# Patient Record
Sex: Female | Born: 1995 | Race: White | Hispanic: No | Marital: Single | Smoking: Never smoker
Health system: Southern US, Community
[De-identification: ages and names within clinical notes are randomized; demographics above are authoritative.]

## PROBLEM LIST (undated history)

## (undated) HISTORY — PX: NO PAST SURGERIES: SHX2092

---

## 2018-06-30 ENCOUNTER — Ambulatory Visit
Admission: EM | Admit: 2018-06-30 | Discharge: 2018-06-30 | Disposition: A | Discharge status: Home / Self Care | Payer: BC Managed Care – PPO | Attending: Family Medicine | Admitting: Family Medicine

## 2018-06-30 ENCOUNTER — Other Ambulatory Visit: Payer: Self-pay

## 2018-06-30 ENCOUNTER — Ambulatory Visit (INDEPENDENT_AMBULATORY_CARE_PROVIDER_SITE_OTHER)
Admit: 2018-06-30 | Discharge: 2018-06-30 | Disposition: A | Discharge status: Home / Self Care | Payer: BC Managed Care – PPO | Attending: Family Medicine | Admitting: Family Medicine

## 2018-06-30 ENCOUNTER — Encounter: Payer: Self-pay | Admitting: Emergency Medicine

## 2018-06-30 DIAGNOSIS — R82998 Other abnormal findings in urine: Secondary | ICD-10-CM

## 2018-06-30 DIAGNOSIS — Z3202 Encounter for pregnancy test, result negative: Secondary | ICD-10-CM | POA: Diagnosis not present

## 2018-06-30 DIAGNOSIS — R1031 Right lower quadrant pain: Secondary | ICD-10-CM | POA: Diagnosis not present

## 2018-06-30 DIAGNOSIS — R52 Pain, unspecified: Secondary | ICD-10-CM

## 2018-06-30 DIAGNOSIS — N926 Irregular menstruation, unspecified: Secondary | ICD-10-CM

## 2018-06-30 DIAGNOSIS — R102 Pelvic and perineal pain: Secondary | ICD-10-CM

## 2018-06-30 LAB — URINALYSIS, COMPLETE (UACMP) WITH MICROSCOPIC
Bilirubin Urine: NEGATIVE
GLUCOSE, UA: NEGATIVE mg/dL
Ketones, ur: 15 mg/dL — AB
Leukocytes, UA: NEGATIVE
NITRITE: NEGATIVE
SPECIFIC GRAVITY, URINE: 1.02 (ref 1.005–1.030)
pH: 6.5 (ref 5.0–8.0)

## 2018-06-30 LAB — PREGNANCY, URINE: PREG TEST UR: NEGATIVE

## 2018-06-30 MED ORDER — NITROFURANTOIN MONOHYD MACRO 100 MG PO CAPS: 100.0000 mg | ORAL_CAPSULE | Freq: Two times a day (BID) | ORAL | 0 refills | Status: AC

## 2018-06-30 NOTE — ED Provider Notes (Signed)
[facility] ____________________________________________  Time seen: Approximately 4:08 PM  I have reviewed the triage vital signs and the nursing notes.  HISTORY  Chief Complaint Pelvic Pain and Metrorrhagia  HPI Peggy Baker is a 22 y.o. female who presented for evaluation of right pelvic pain with accompanying irregular menstrual bleeding.  Please see previous providers note.  Patient was resumed from Dr. Judd Gaudier pending evaluation of pelvic ultrasound.  In summary, patient presented for evaluation of right pelvic pain present since this past Friday with accompanying irregular vaginal bleeding.  States that she takes oral contraceptives, same medication for the last year, but reports her current started 2 weeks early. Reporting accompanying pelvic pain with this.  States pain is currently mild, up to moderate and intermittently sharp.  Does report that she is noticed urinary frequency accompanying this as well.  No burning with urination but states she is definitely going to the bathroom much more often and with some urgency.  No accompanying vaginal discharge, vaginal complaints, rash, concerns of STDs, other abdominal pain, back pain, fevers, vomiting or diarrhea. Not currently sexually active.  Denies any chance of current pregnancy.  No trauma. Reports otherwise doing well.  No previous abdominal issues, pregnancies or surgeries.   Patient's last menstrual period was 06/27/2018. current  Currently visiting here from [facility].  History reviewed. No pertinent past medical history.  There are no active problems to display for this patient.  No current facility-administered medications for this encounter.   Current Outpatient Medications:  .  AZURETTE 0.15-0.02/0.01 MG (21/5) tablet, Take 1 tablet by mouth daily., Disp: , Rfl: 3 .  VYVANSE 50 MG capsule, TAKE 1 CAPSULE BY MOUTH ONCE DAILY FOR 30 DAYS, Disp: , Rfl: 0 .  nitrofurantoin,  macrocrystal-monohydrate, (MACROBID) 100 MG capsule, Take 1 capsule (100 mg total) by mouth 2 (two) times daily., Disp: 10 capsule, Rfl: 0  Allergies Amoxicillin; Penicillins; and Sulfa antibiotics  Family History  Problem Relation Age of Onset  . Healthy Mother   . Healthy Father     Social History Social History   Tobacco Use  . Smoking status: Never Smoker  . Smokeless tobacco: Never Used  Substance Use Topics  . Alcohol use: Never    Frequency: Never  . Drug use: Never    Review of Systems Constitutional: No fever Gastrointestinal: As above. No nausea, no vomiting.  No diarrhea.  No constipation. Genitourinary: positive for dysuria. Musculoskeletal: Negative for back pain.  ____________________________________________   PHYSICAL EXAM:  VITAL SIGNS: ED Triage Vitals  Enc Vitals Group     BP 06/30/18 1355 123/88     Pulse Rate 06/30/18 1355 89     Resp 06/30/18 1355 17     Temp 06/30/18 1355 97.9 F (36.6 C)     Temp Source 06/30/18 1355 Oral     SpO2 06/30/18 1355 100 %     Weight 06/30/18 1221 145 lb (65.8 kg)     Height 06/30/18 1221 5\' 2"  (1.575 m)     Head Circumference --      Peak Flow --      Pain Score 06/30/18 1220 1     Pain Loc --      Pain Edu? --      Excl. in GC? --     Constitutional: Alert and oriented. Well appearing and in no acute distress. ENT      Head: Normocephalic and atraumatic. Cardiovascular: Normal rate, regular rhythm. Grossly normal heart sounds.  Good  peripheral circulation. Respiratory: Normal respiratory effort without tachypnea nor retractions. Breath sounds are clear and equal bilaterally. No wheezes, rales, rhonchi. Gastrointestinal: No distention. Normal Bowel sounds. Minimal midline suprapubic tenderness to palpation. Mild to moderate right suprapubic tenderness to palpation. No tenderness at McBurney's point. Abdomen otherwise soft and nontender. Non-guarding.  Musculoskeletal:  Steady gait.  Skin:  Skin is warm,  dry.    ___________________________________________   LABS (all labs ordered are listed, but only abnormal results are displayed)  Labs Reviewed  URINALYSIS, COMPLETE (UACMP) WITH MICROSCOPIC - Abnormal; Notable for the following components:      Result Value   Hgb urine dipstick SMALL (*)    Ketones, ur 15 (*)    Protein, ur TRACE (*)    Bacteria, UA RARE (*)    All other components within normal limits  PREGNANCY, URINE   ____________________________________________  RADIOLOGY  US Pelvic Complete With Transvaginal  Result Date: 06/30/2018 CLINICAL DATA:  Right lower quadrant pain for 5 days. EXAM: TRANSABDOMINAL AND TRANSVAGINAL ULTRASOUND OF PELVIS TECHNIQUE: Both transabdominal and transvaginal ultrasound examinations of the pelvis were performed. Transabdominal technique was performed for global imaging of the pelvis including uterus, ovaries, adnexal regions, and pelvic cul-de-sac. It was necessary to proceed with endovaginal exam following the transabdominal exam to better visualize the endometrium and ovaries. COMPARISON:  None FINDINGS: Uterus Measurements: 8.2 x 3.3 x 3.8 cm = volume: 53 mL. No fibroids or other mass visualized. Endometrium Thickness: 5 mm.  No focal abnormality visualized. Right ovary Measurements: 2.9 x 1.5 x 2.4 cm = volume: 5.3 mL. Normal appearance/no adnexal mass. Left ovary Measurements: 2.2 x 1.5 x 3.2 cm = volume: 5.4 mL. Normal appearance/no adnexal mass. Other findings No abnormal free fluid. IMPRESSION: Negative pelvic ultrasound. Electronically Signed   By: Sebastian Ache M.D.   On: 06/30/2018 15:42   ____________________________________________   PROCEDURES Procedures INITIAL IMPRESSION / ASSESSMENT AND PLAN / ED COURSE  Pertinent labs & imaging results that were available during my care of the patient were reviewed by me and considered in my medical decision making (see chart for details).  Patient pelvic ultrasound was reviewed with her.  Patient was seen by Dr. Judd Gaudier and results of ultrasound given.  Patient abdomen right suprapubic tenderness, some suprapubic midline tenderness as well.  No tenderness at McBurney's point.  Ultrasound as above per radiologist report, negative pelvic ultrasound. Recommend for patient to follow back up with her OB/GYN this week when she returns home.  As patient does report accompanying dysuria and urinalysis reviewed concern for UTI and will treat with oral Macrobid.  Discussed very strict follow-up and return parameters as well as if any pain increases to abdomen including migrating upwards and abdomen strict follow-up and including proceeding directly to ER to exclude appendicitis.  Patient states that she is feeling okay right now and will follow-up outpatient.Discussed indication, risks and benefits of medications with patient.  Discussed follow up with OBGYN this week. Discussed follow up and return parameters including no resolution or any worsening concerns. Patient verbalized understanding and agreed to plan.   ____________________________________________   FINAL CLINICAL IMPRESSION(S) / ED DIAGNOSES  Final diagnoses:  Pain  Pelvic pain in female  Crystalluria     ED Discharge Orders         Ordered    nitrofurantoin, macrocrystal-monohydrate, (MACROBID) 100 MG capsule  2 times daily     06/30/18 1606           Note: This dictation was  prepared with Dragon dictation along with smaller phrase technology. Any transcriptional errors that result from this process are unintentional.         Renford Dills, NP 06/30/18 2114

## 2018-06-30 NOTE — ED Provider Notes (Signed)
[facility]    CSN: [id number] Arrival date & time: 06/30/18  1143     History   Chief Complaint Chief Complaint  Patient presents with  . Pelvic Pain  . Metrorrhagia    HPI Roselene Gray is a 22 y.o. female.   21 yo female with a c/o intermittent, cramping, right side pelvic pain for the past 5 days, associated with the start of her menstrual period which started the same day as the pain but 2 weeks earlier than expected. Patient denies any fevers, chills, nausea, vomiting, dysuria, diarrhea, hematuria, constipation, injuries. Patient still currently on her menses. Denies any vaginal discharge. Currently pain is 2/10.   The history is provided by the patient.  Pelvic Pain     History reviewed. No pertinent past medical history.  There are no active problems to display for this patient.     OB History   None      Home Medications    Prior to Admission medications   Medication Sig Start Date End Date Taking? Authorizing Provider  AZURETTE 0.15-0.02/0.01 MG (21/5) tablet Take 1 tablet by mouth daily. 06/27/18  Yes [provider]  VYVANSE 50 MG capsule TAKE 1 CAPSULE BY MOUTH ONCE DAILY FOR 30 DAYS 06/04/18  Yes [provider]    Family History Family History  Problem Relation Age of Onset  . Healthy Mother   . Healthy Father     Social History Social History   Tobacco Use  . Smoking status: Never Smoker  . Smokeless tobacco: Never Used  Substance Use Topics  . Alcohol use: Never    Frequency: Never  . Drug use: Never     Allergies   Amoxicillin; Penicillins; and Sulfa antibiotics   Review of Systems Review of Systems  Genitourinary: Positive for pelvic pain.     Physical Exam Triage Vital Signs ED Triage Vitals  Enc Vitals Group     BP 06/30/18 1355 123/88     Pulse Rate 06/30/18 1355 89     Resp 06/30/18 1355 17     Temp 06/30/18 1355 97.9 F (36.6 C)     Temp Source 06/30/18 1355 Oral     SpO2  06/30/18 1355 100 %     Weight 06/30/18 1221 145 lb (65.8 kg)     Height 06/30/18 1221 5\' 2"  (1.575 m)     Head Circumference --      Peak Flow --      Pain Score 06/30/18 1220 1     Pain Loc --      Pain Edu? --      Excl. in GC? --    No data found.  Updated Vital Signs BP 123/88 (BP Location: Left Arm)   Pulse 89   Temp 97.9 F (36.6 C) (Oral)   Resp 17   Ht 5\' 2"  (1.575 m)   Wt 65.8 kg   LMP 06/27/2018   SpO2 100%   BMI 26.52 kg/m   Visual Acuity Right Eye Distance:   Left Eye Distance:   Bilateral Distance:    Right Eye Near:   Left Eye Near:    Bilateral Near:     Physical Exam  Constitutional: She appears well-developed and well-nourished. No distress.  Cardiovascular: Normal rate.  Pulmonary/Chest: Effort normal. No respiratory distress.  Abdominal: Soft. Bowel sounds are normal. She exhibits no distension and no mass. There is tenderness (right pelvic; mild; no rebound or guarding). There is no rebound and no  guarding. No hernia.  Skin: She is not diaphoretic.  Nursing note and vitals reviewed.    UC Treatments / Results  Labs (all labs ordered are listed, but only abnormal results are displayed) Labs Reviewed  URINALYSIS, COMPLETE (UACMP) WITH MICROSCOPIC - Abnormal; Notable for the following components:      Result Value   Hgb urine dipstick SMALL (*)    Ketones, ur 15 (*)    Protein, ur TRACE (*)    Bacteria, UA RARE (*)    All other components within normal limits  PREGNANCY, URINE    EKG None  Radiology No results found.  Procedures Procedures (including critical care time)  Medications Ordered in UC Medications - No data to display  Initial Impression / Assessment and Plan / UC Course  I have reviewed the triage vital signs and the nursing notes.  Pertinent labs & imaging results that were available during my care of the patient were reviewed by me and considered in my medical decision making (see chart for details).       Final Clinical Impressions(s) / UC Diagnoses   Final diagnoses:  Pain  Pelvic pain in female  Crystalluria    ED Prescriptions    None     1. urine results reviewed with patient; discussed possible etiologies, including possible ovarian follicular cyst;  recommend complete pelvic US with transvaginal today. Further recommendations pending ultrasound results.   Controlled Substance Prescriptions [place] Controlled Substance Registry consulted? Not Applicable   Payton Mccallum, MD 06/30/18 1423

## 2018-06-30 NOTE — Discharge Instructions (Signed)
Take medication as prescribed. Rest. Drink plenty of fluids. Monitor closely.  Follow-up with your OB/GYN closely as discussed.   Return to urgent care as needed.  For any increased pain, particularly in the right lower area of her abdomen proceed directly to the emergency room for reevaluation.

## 2018-06-30 NOTE — ED Triage Notes (Signed)
Pt has been on birth control for almost a year not and have been regular. Then she had a period 2 weeks early. She has also been waking up int he middle of the night with sharp pain in her pelvic area on the right side. Pt states there is no chance she could be pregnant.

## 2019-02-02 IMAGING — US US PELVIS COMPLETE TRANSABD/TRANSVAG
1 series · 14 of 25 positions shown · non-contrast
Comparison: None

CLINICAL DATA: Right lower quadrant pain for 5 days.

EXAM:
TRANSABDOMINAL AND TRANSVAGINAL ULTRASOUND OF PELVIS
TECHNIQUE: Both transabdominal and transvaginal ultrasound examinations of the
pelvis were performed. Transabdominal technique was performed for
global imaging of the pelvis including uterus, ovaries, adnexal
regions, and pelvic cul-de-sac. It was necessary to proceed with
endovaginal exam following the transabdominal exam to better
visualize the endometrium and ovaries.

[Series 1: us pelvis complete transabd/transvag · 0.20mm/px · 14 of 72 slices shown]
[im 1/72]
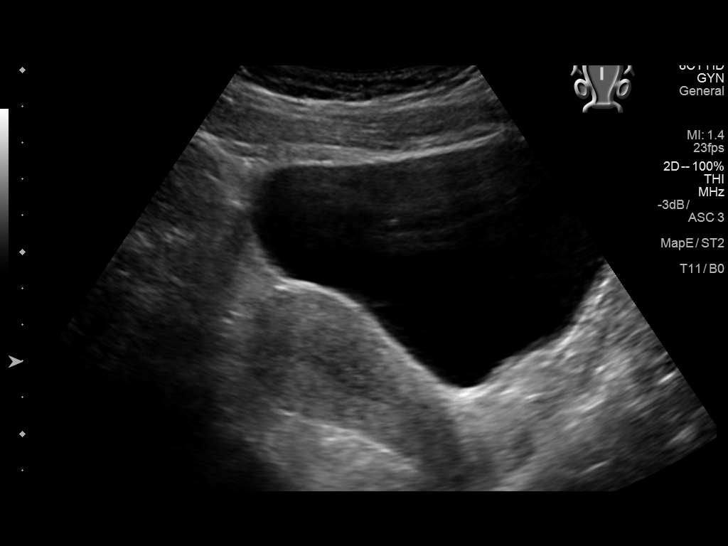
[im 6/72]
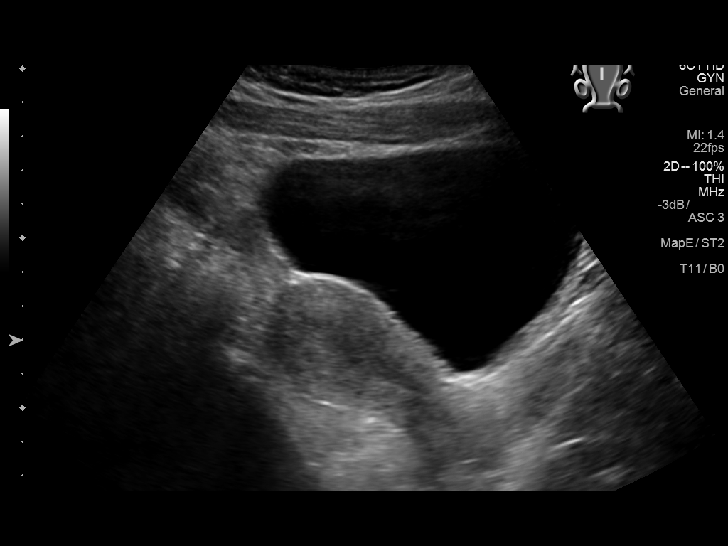
[im 12/72]
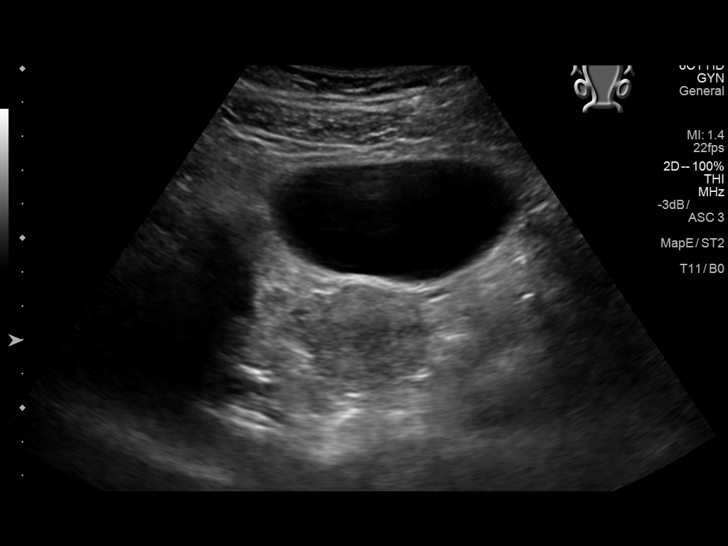
[im 18/72]
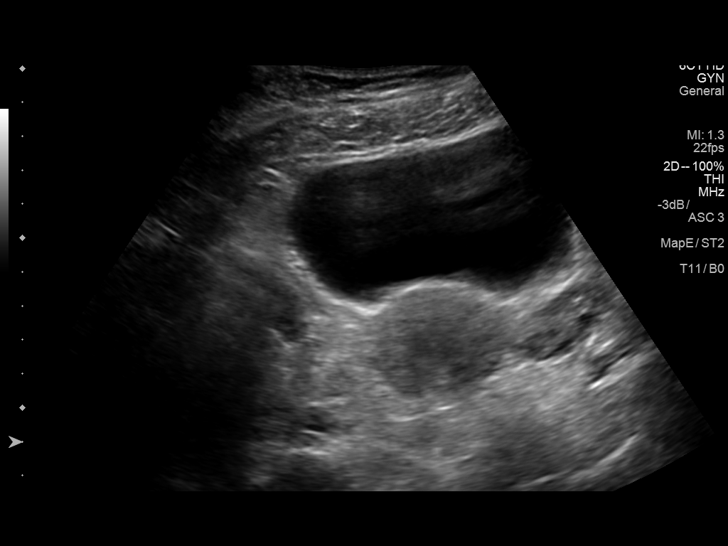
[im 24/72]
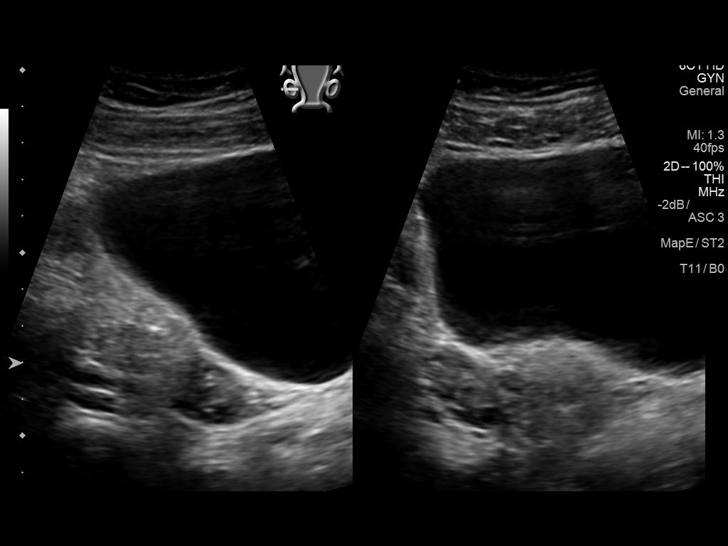
[im 27/72]
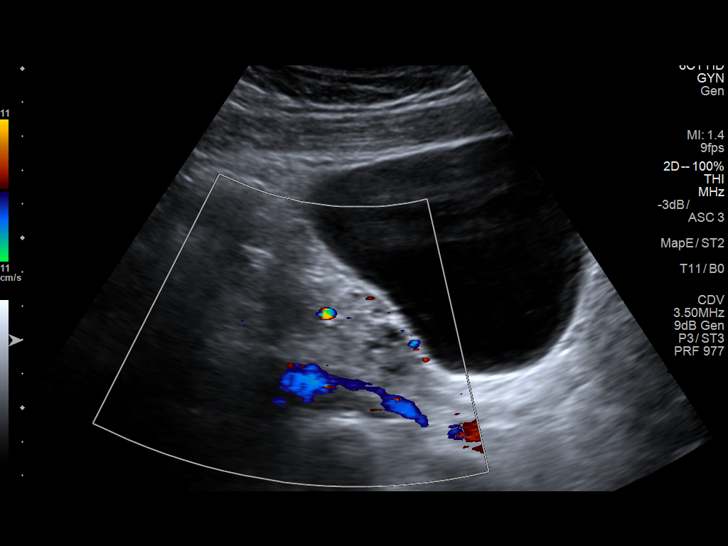
[im 33/72]
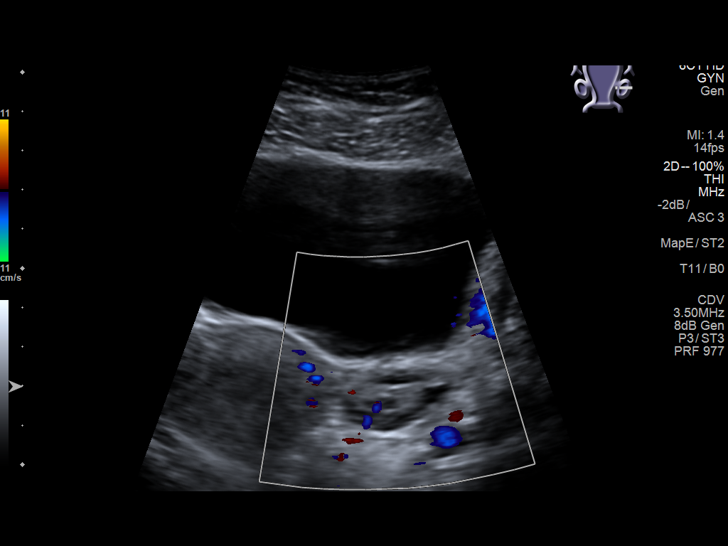
[im 39/72]
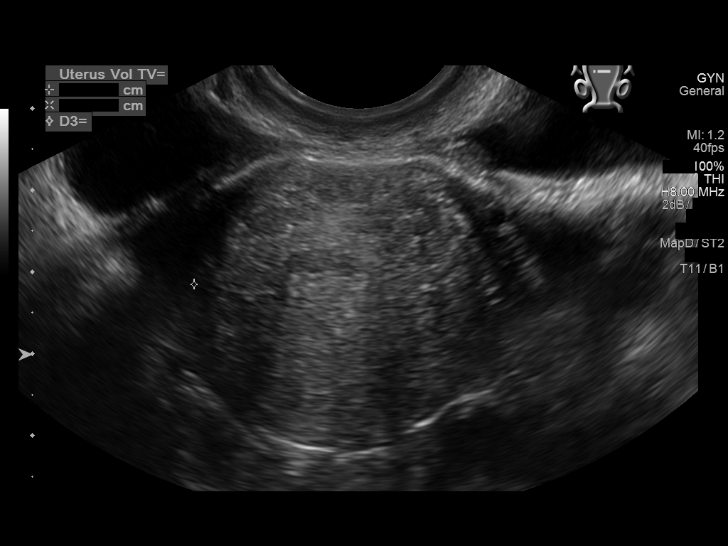
[im 45/72]
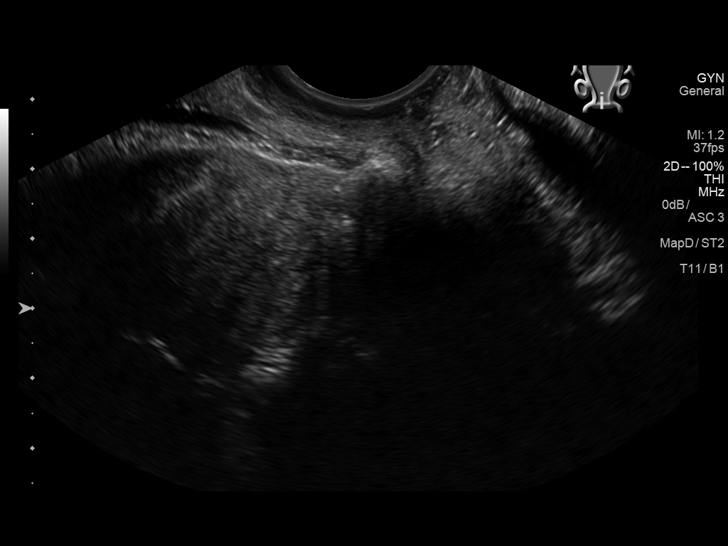
[im 48/72]
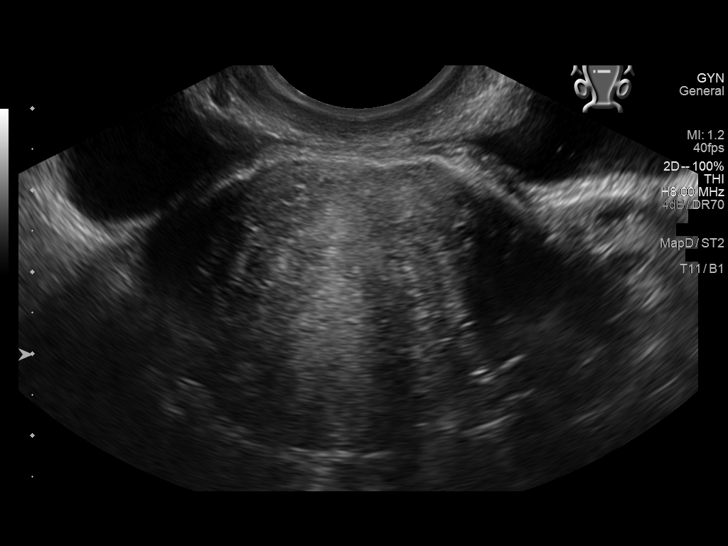
[im 54/72]
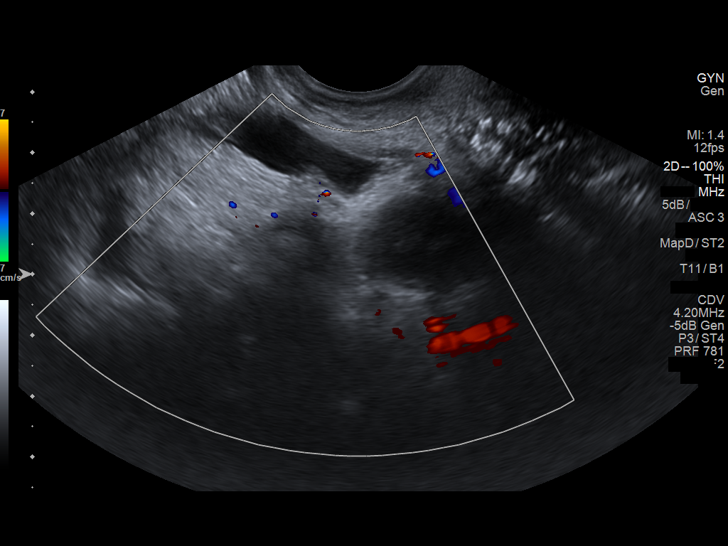
[im 60/72]
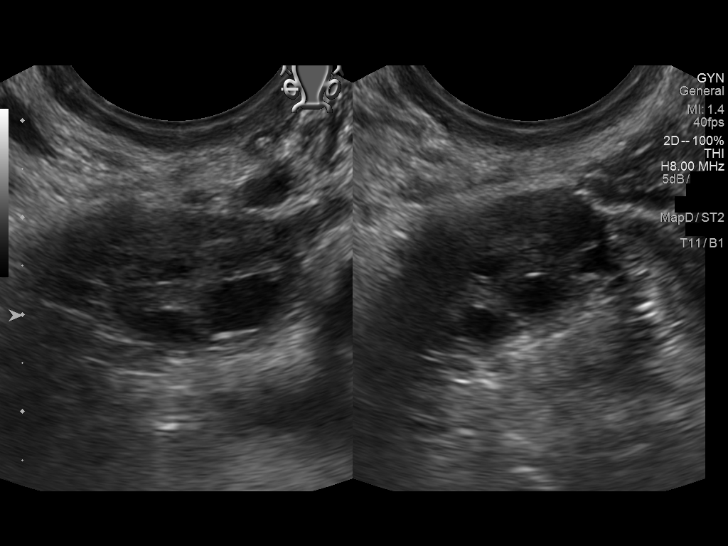
[im 66/72]
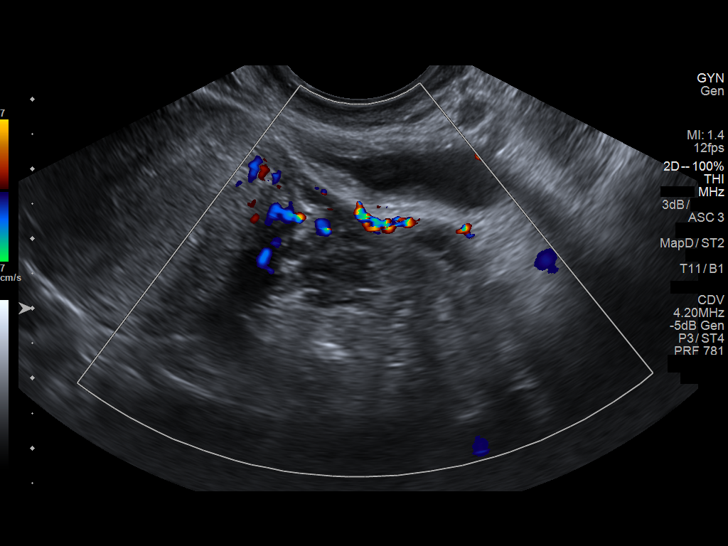
[im 72/72]
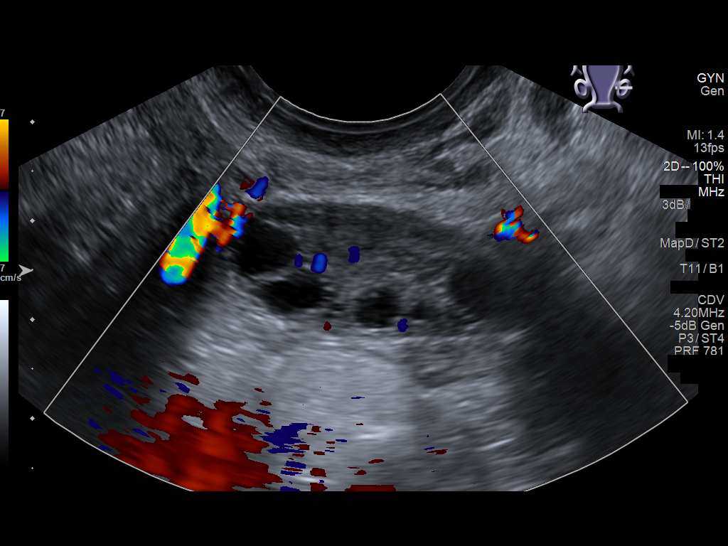

[14 of 25 positions shown; findings below may reference images not displayed]

FINDINGS: Uterus

Measurements: 8.2 x 3.3 x 3.8 cm = volume: 53 mL. No fibroids or
other mass visualized.

Endometrium

Thickness: 5 mm.  No focal abnormality visualized.

Right ovary

Measurements: 2.9 x 1.5 x 2.4 cm = volume: 5.3 mL. Normal
appearance/no adnexal mass.

Left ovary

Measurements: 2.2 x 1.5 x 3.2 cm = volume: 5.4 mL. Normal
appearance/no adnexal mass.

Other findings

No abnormal free fluid.
IMPRESSION: Negative pelvic ultrasound.
